# Patient Record
Sex: Male | Born: 1990 | Race: White | Hispanic: No | Marital: Single | Smoking: Current every day smoker
Health system: Southern US, Community
[De-identification: ages and names within clinical notes are randomized; demographics above are authoritative.]

---

## 2010-03-10 ENCOUNTER — Emergency Department (HOSPITAL_COMMUNITY)
Admission: EM | Admit: 2010-03-10 | Discharge: 2010-03-10 | Disposition: A | Discharge status: Home / Self Care | Payer: PRIVATE HEALTH INSURANCE | Attending: Emergency Medicine | Admitting: Emergency Medicine

## 2010-03-10 DIAGNOSIS — K029 Dental caries, unspecified: Secondary | ICD-10-CM | POA: Insufficient documentation

## 2010-03-10 DIAGNOSIS — K089 Disorder of teeth and supporting structures, unspecified: Secondary | ICD-10-CM | POA: Insufficient documentation

## 2014-08-29 ENCOUNTER — Emergency Department (HOSPITAL_COMMUNITY): Payer: Self-pay

## 2014-08-29 ENCOUNTER — Emergency Department (HOSPITAL_COMMUNITY): Payer: PRIVATE HEALTH INSURANCE

## 2014-08-29 ENCOUNTER — Encounter (HOSPITAL_COMMUNITY): Payer: Self-pay | Admitting: Emergency Medicine

## 2014-08-29 ENCOUNTER — Emergency Department (HOSPITAL_COMMUNITY)
Admission: EM | Admit: 2014-08-29 | Discharge: 2014-08-29 | Disposition: A | Discharge status: Home / Self Care | Payer: PRIVATE HEALTH INSURANCE | Attending: Emergency Medicine | Admitting: Emergency Medicine

## 2014-08-29 DIAGNOSIS — R109 Unspecified abdominal pain: Secondary | ICD-10-CM | POA: Insufficient documentation

## 2014-08-29 DIAGNOSIS — N132 Hydronephrosis with renal and ureteral calculous obstruction: Secondary | ICD-10-CM | POA: Insufficient documentation

## 2014-08-29 DIAGNOSIS — M549 Dorsalgia, unspecified: Secondary | ICD-10-CM

## 2014-08-29 DIAGNOSIS — Z720 Tobacco use: Secondary | ICD-10-CM | POA: Insufficient documentation

## 2014-08-29 DIAGNOSIS — N50812 Left testicular pain: Secondary | ICD-10-CM

## 2014-08-29 LAB — URINALYSIS, ROUTINE W REFLEX MICROSCOPIC
BILIRUBIN URINE: NEGATIVE
GLUCOSE, UA: NEGATIVE mg/dL
Ketones, ur: NEGATIVE mg/dL
Nitrite: NEGATIVE
PH: 6 (ref 5.0–8.0)
Protein, ur: NEGATIVE mg/dL
Specific Gravity, Urine: 1.024 (ref 1.005–1.030)
Urobilinogen, UA: 0.2 mg/dL (ref 0.0–1.0)

## 2014-08-29 LAB — CBC WITH DIFFERENTIAL/PLATELET
BASOS PCT: 0 % (ref 0–1)
Basophils Absolute: 0.1 10*3/uL (ref 0.0–0.1)
EOS PCT: 3 % (ref 0–5)
Eosinophils Absolute: 0.4 10*3/uL (ref 0.0–0.7)
HCT: 45 % (ref 39.0–52.0)
Hemoglobin: 15.4 g/dL (ref 13.0–17.0)
Lymphocytes Relative: 21 % (ref 12–46)
Lymphs Abs: 2.7 10*3/uL (ref 0.7–4.0)
MCH: 30.5 pg (ref 26.0–34.0)
MCHC: 34.2 g/dL (ref 30.0–36.0)
MCV: 89.1 fL (ref 78.0–100.0)
Monocytes Absolute: 1.1 10*3/uL — ABNORMAL HIGH (ref 0.1–1.0)
Monocytes Relative: 8 % (ref 3–12)
NEUTROS ABS: 8.7 10*3/uL — AB (ref 1.7–7.7)
Neutrophils Relative %: 68 % (ref 43–77)
Platelets: 258 10*3/uL (ref 150–400)
RBC: 5.05 MIL/uL (ref 4.22–5.81)
RDW: 14.1 % (ref 11.5–15.5)
WBC: 12.9 10*3/uL — AB (ref 4.0–10.5)

## 2014-08-29 LAB — BASIC METABOLIC PANEL
Anion gap: 9 (ref 5–15)
BUN: 8 mg/dL (ref 6–20)
CALCIUM: 9.5 mg/dL (ref 8.9–10.3)
CO2: 25 mmol/L (ref 22–32)
Chloride: 108 mmol/L (ref 101–111)
Creatinine, Ser: 0.76 mg/dL (ref 0.61–1.24)
GFR calc Af Amer: >60 mL/min (ref >60)
Glucose, Bld: 92 mg/dL (ref 65–99)
Potassium: 3.8 mmol/L (ref 3.5–5.1)
Sodium: 142 mmol/L (ref 135–145)

## 2014-08-29 LAB — URINE MICROSCOPIC-ADD ON

## 2014-08-29 MED ORDER — HYDROCODONE-ACETAMINOPHEN 5-325 MG PO TABS: 1.0000 | ORAL_TABLET | ORAL | Status: DC | PRN

## 2014-08-29 MED ORDER — IBUPROFEN 600 MG PO TABS: 600.0000 mg | ORAL_TABLET | Freq: Four times a day (QID) | ORAL | Status: AC | PRN

## 2014-08-29 MED ORDER — ONDANSETRON HCL 4 MG PO TABS: 4.0000 mg | ORAL_TABLET | Freq: Four times a day (QID) | ORAL | Status: AC

## 2014-08-29 NOTE — ED Notes (Signed)
Pt sts episode x 2 of  Severe left testicle pain into groin area

## 2014-08-29 NOTE — ED Provider Notes (Signed)
CSN: [id number]     Arrival date & time 08/29/14  4098 History  This chart was scribed for non-physician practitioner, Dorena Dew. Neva Seat, PA-C working with Mancel Bale, MD by Gwenyth Ober, ED scribe. This patient was seen in room TR02C/TR02C and the patient's care was started at 7:33 PM  Chief Complaint  Patient presents with  . Testicle Pain   The history is provided by the patient. No language interpreter was used.   HPI Comments: Joshua Harrell is a 24 y.o. male who presents to the Emergency Department complaining of left-sided flank pain that radiates to his left testicle started 4 days ago, resolved yesterday, and returned today. Pt states nausea as an associated symptom. He also notes his urine is darker than normal. Pt denies a history of kidney stones or similar pain. He also denies dysuria, gross hematuria, vomiting, swelling in his testicles, pain with touching his penis and penile discharge as associated symptoms.   PCP: No primary care provider on file. Blood pressure 126/79, pulse 68, temperature 98 F (36.7 C), temperature source Oral, resp. rate 17, height 5\' 10"  (1.778 m), weight 192 lb (87.091 kg), SpO2 99 %.  The patient denies diaphoresis, fever, headache, weakness (general or focal), confusion, change of vision,  neck pain, dysphagia, aphagia, chest pain, shortness of breath,  back pain,  vomiting, diarrhea, lower extremity swelling, rash.  History reviewed. No pertinent past medical history. History reviewed. No pertinent past surgical history. History reviewed. No pertinent family history. History  Substance Use Topics  . Smoking status: Current Every Day Smoker  . Smokeless tobacco: Not on file  . Alcohol Use: Yes    Review of Systems  Gastrointestinal: Positive for nausea. Negative for vomiting.  Genitourinary: Positive for flank pain. Negative for dysuria, hematuria, discharge, penile swelling, scrotal swelling and penile pain.  All other systems reviewed and are  negative.   Allergies  Review of patient's allergies indicates no known allergies.  Home Medications   Prior to Admission medications   Medication Sig Start Date End Date Taking? Authorizing Provider  HYDROcodone-acetaminophen (NORCO/VICODIN) 5-325 MG per tablet Take 1-2 tablets by mouth every 4 (four) hours as needed. 08/29/14   Kip Cropp Neva Seat, PA-C  ibuprofen (ADVIL,MOTRIN) 600 MG tablet Take 1 tablet (600 mg total) by mouth every 6 (six) hours as needed. 08/29/14   Kais Monje Neva Seat, PA-C  ondansetron (ZOFRAN) 4 MG tablet Take 1 tablet (4 mg total) by mouth every 6 (six) hours. 08/29/14   Shanisha Lech Neva Seat, PA-C   BP 126/79 mmHg  Pulse 68  Temp(Src) 98 F (36.7 C) (Oral)  Resp 17  Ht 5\' 10"  (1.778 m)  Wt 192 lb (87.091 kg)  BMI 27.55 kg/m2  SpO2 99% Physical Exam  Constitutional: He appears well-developed and well-nourished. No distress.  HENT:  Head: Normocephalic and atraumatic.  Eyes: Conjunctivae and EOM are normal.  Neck: Neck supple. No tracheal deviation present.  Cardiovascular: Normal rate.   Pulmonary/Chest: Effort normal. No respiratory distress.  Abdominal: Bowel sounds are normal. There is no hepatosplenomegaly. There is tenderness. There is CVA tenderness (left). There is no rigidity, no rebound and no guarding.  Genitourinary:  No swelling or pain to testicles  Skin: Skin is warm and dry.  Psychiatric: He has a normal mood and affect. His behavior is normal.  Nursing note and vitals reviewed.   ED Course  Procedures   DIAGNOSTIC STUDIES: Oxygen Saturation is 99% on RA, normal by my interpretation.    COORDINATION OF CARE: 8:44 PM  Discussed treatment plan with pt which includes CBC, BMP and CT abdomen pelvis without contrast. Pt declined pain medication. He agreed to plan.  Labs Review Labs Reviewed  URINALYSIS, ROUTINE W REFLEX MICROSCOPIC (NOT AT [facility]) - Abnormal; Notable for the following:    Color, Urine AMBER (*)    APPearance CLOUDY (*)    Hgb urine  dipstick LARGE (*)    Leukocytes, UA TRACE (*)    All other components within normal limits  URINE MICROSCOPIC-ADD ON    Imaging Review US Scrotum  08/29/2014   CLINICAL DATA:  24 year old male complaining of 2 episodes of severe left-sided testicular pain since last Thursday. He  EXAM: ULTRASOUND OF SCROTUM  TECHNIQUE: Complete ultrasound examination of the testicles, epididymis, and other scrotal structures was performed.  COMPARISON:  No priors.  FINDINGS: Right testicle  Measurements: 4.2 x 2.6 x 2.9 cm. No mass or microlithiasis visualized.  Left testicle  Measurements: 4.3 x 2.6 x 2.5 cm. No mass or microlithiasis visualized.  Right epididymis: Normal in size. Tiny 2 mm anechoic lesion with increased through transmission, compatible with an epididymal cyst or spermatocele.  Left epididymis: Normal in size. Several tiny anechoic lesions with increased through transmission, compatible with epididymal cysts or spermatoceles, largest of which measures 4 x 5 x 5 mm.  Hydrocele:  None visualized.  Varicocele:  None visualized.  IMPRESSION: 1. No acute findings. Specifically, no evidence of testicular torsion, and no findings to suggest epididymo-orchitis. 2. Multiple small epididymal cysts or spermatoceles bilaterally (left greater than right), as above.   Electronically Signed   By: Trudie Reed M.D.   On: 08/29/2014 19:29   [place] Art/ven Flow Abd Pelv Doppler  08/29/2014   CLINICAL DATA:  24 year old male complaining of 2 episodes of severe left-sided testicular pain since last Thursday. He  EXAM: ULTRASOUND OF SCROTUM  TECHNIQUE: Complete ultrasound examination of the testicles, epididymis, and other scrotal structures was performed.  COMPARISON:  No priors.  FINDINGS: Right testicle  Measurements: 4.2 x 2.6 x 2.9 cm. No mass or microlithiasis visualized.  Left testicle  Measurements: 4.3 x 2.6 x 2.5 cm. No mass or microlithiasis visualized.  Right epididymis: Normal in size. Tiny 2 mm anechoic lesion  with increased through transmission, compatible with an epididymal cyst or spermatocele.  Left epididymis: Normal in size. Several tiny anechoic lesions with increased through transmission, compatible with epididymal cysts or spermatoceles, largest of which measures 4 x 5 x 5 mm.  Hydrocele:  None visualized.  Varicocele:  None visualized.  IMPRESSION: 1. No acute findings. Specifically, no evidence of testicular torsion, and no findings to suggest epididymo-orchitis. 2. Multiple small epididymal cysts or spermatoceles bilaterally (left greater than right), as above.   Electronically Signed   By: Trudie Reed M.D.   On: 08/29/2014 19:29     EKG Interpretation None      MDM   Final diagnoses:  CVA tenderness  Ureteral stone with hydronephrosis   The patient had [place] of his scrotum ordered in triage prior to my examination. He has not had pain to his testicles or swelling. He felt pain going towards his testicles. Blood in his urine but no infection, small 3 mm stone   CT abd/pelv wo contrast IMPRESSION: Mild left-sided hydronephrosis, with an obstructing 3 mm stone in the proximal left ureter, just below the left ureteropelvic Junction.  The stone is small and is no longer causing him pain. I ask that he follow-up with Urology, he did not require  any pain medication during his stay. Will encourage him to drink plenty of water, given rx for pain and nausea medication and safety guidelines regarding medications. Referral to Urology.   Electronically Signed By: Roanna Raider M.D. On: 08/29/2014 21:48 Medications - No data to display  24 y.o.Elenore Paddy evaluation in the Emergency Department is complete. It has been determined that no acute conditions requiring further emergency intervention are present at this time. The patient/guardian have been advised of the diagnosis and plan. We have discussed signs and symptoms that warrant return to the ED, such as changes or worsening in  symptoms.  Vital signs are stable at discharge. Filed Vitals:   08/29/14 2223  BP: 126/79  Pulse: 68  Temp: 98 F (36.7 C)  Resp: 17    Patient/guardian has voiced understanding and agreed to follow-up with the PCP or specialist.  I personally performed the services described in this documentation, which was scribed in my presence. The recorded information has been reviewed and is accurate.       Marlon Pel, PA-C 08/29/14 2330  Mancel Bale, MD 08/31/14 [phone]

## 2014-08-29 NOTE — Discharge Instructions (Signed)

## 2014-08-31 LAB — URINE CULTURE
Culture: 4000
Special Requests: NORMAL

## 2015-03-18 ENCOUNTER — Encounter (HOSPITAL_COMMUNITY): Payer: Self-pay | Admitting: *Deleted

## 2015-03-18 ENCOUNTER — Emergency Department (HOSPITAL_COMMUNITY)
Admission: EM | Admit: 2015-03-18 | Discharge: 2015-03-18 | Disposition: A | Discharge status: Home / Self Care | Payer: 59 | Attending: Emergency Medicine | Admitting: Emergency Medicine

## 2015-03-18 ENCOUNTER — Emergency Department (HOSPITAL_COMMUNITY): Payer: 59

## 2015-03-18 DIAGNOSIS — F17200 Nicotine dependence, unspecified, uncomplicated: Secondary | ICD-10-CM | POA: Diagnosis not present

## 2015-03-18 DIAGNOSIS — S52121A Displaced fracture of head of right radius, initial encounter for closed fracture: Secondary | ICD-10-CM | POA: Diagnosis not present

## 2015-03-18 DIAGNOSIS — S59901A Unspecified injury of right elbow, initial encounter: Secondary | ICD-10-CM | POA: Diagnosis present

## 2015-03-18 DIAGNOSIS — Y9351 Activity, roller skating (inline) and skateboarding: Secondary | ICD-10-CM | POA: Insufficient documentation

## 2015-03-18 DIAGNOSIS — Y9289 Other specified places as the place of occurrence of the external cause: Secondary | ICD-10-CM | POA: Diagnosis not present

## 2015-03-18 DIAGNOSIS — Y998 Other external cause status: Secondary | ICD-10-CM | POA: Insufficient documentation

## 2015-03-18 DIAGNOSIS — S4991XA Unspecified injury of right shoulder and upper arm, initial encounter: Secondary | ICD-10-CM | POA: Insufficient documentation

## 2015-03-18 DIAGNOSIS — S6991XA Unspecified injury of right wrist, hand and finger(s), initial encounter: Secondary | ICD-10-CM | POA: Diagnosis not present

## 2015-03-18 DIAGNOSIS — S42401A Unspecified fracture of lower end of right humerus, initial encounter for closed fracture: Secondary | ICD-10-CM

## 2015-03-18 MED ORDER — HYDROCODONE-ACETAMINOPHEN 5-325 MG PO TABS
1.0000 | ORAL_TABLET | Freq: Once | ORAL | Status: AC
  Administered 2015-03-18: 1 via ORAL
  Filled 2015-03-18: qty 1

## 2015-03-18 MED ORDER — NAPROXEN 500 MG PO TABS: 500.0000 mg | ORAL_TABLET | Freq: Two times a day (BID) | ORAL | Status: DC

## 2015-03-18 MED ORDER — PENICILLIN V POTASSIUM 500 MG PO TABS: 500.0000 mg | ORAL_TABLET | Freq: Four times a day (QID) | ORAL | Status: DC

## 2015-03-18 MED ORDER — HYDROCODONE-ACETAMINOPHEN 5-325 MG PO TABS: 1.0000 | ORAL_TABLET | ORAL | Status: AC | PRN

## 2015-03-18 NOTE — ED Provider Notes (Signed)
CSN: [id number]     Arrival date & time 03/18/15  1709 History  By signing my name below, I, Emmanuella Mensah, attest that this documentation has been prepared under the direction and in the presence of [employer], PA-C . Electronically Signed: Angelene Giovanni, ED Scribe. 03/18/2015. 5:55 PM.    Chief Complaint  Patient presents with  . Arm Injury   The history is provided by the patient. No language interpreter was used.   HPI Comments: Joshua Harrell is a 25 y.o. male who presents to the Emergency Department complaining of gradually worsening constant right arm pain, concentrated at the right elbow and cubital fossa and right wrist pain s/p fall that occurred several hours ago. He reports associated limited ROM secondary to pain. He explains that he fell while skateboarding down a ramp attached to the back of a van, falling on his right side, hitting his elbow. No alleviating factors noted. Pt has not taken any medications for these symptoms. He denies any numbness, weakness, fever, chills, or n/v.    History reviewed. No pertinent past medical history. History reviewed. No pertinent past surgical history. History reviewed. No pertinent family history. Social History  Substance Use Topics  . Smoking status: Current Every Day Smoker  . Smokeless tobacco: Never Used  . Alcohol Use: 0.6 oz/week    1 Cans of beer per week     Comment: social    Review of Systems  Constitutional: Negative for fever and chills.  Gastrointestinal: Negative for nausea and vomiting.  Musculoskeletal: Positive for arthralgias (right arm, right elbow, right wrist).  Neurological: Negative for weakness and numbness.  All other systems reviewed and are negative.     Allergies  Fish allergy  Home Medications   Prior to Admission medications   Medication Sig Start Date End Date Taking? Authorizing Provider  ibuprofen (ADVIL,MOTRIN) 600 MG tablet Take 1 tablet (600 mg total) by mouth every 6 (six)  hours as needed. 08/29/14  Yes Tiffany Neva Seat, PA-C  ondansetron (ZOFRAN) 4 MG tablet Take 1 tablet (4 mg total) by mouth every 6 (six) hours. 08/29/14  Yes Marlon Pel, PA-C  HYDROcodone-acetaminophen (NORCO/VICODIN) 5-325 MG tablet Take 1-2 tablets by mouth every 4 (four) hours as needed. 03/18/15   Joycie Peek, PA-C   BP 140/91 mmHg  Pulse 94  Temp(Src) 98.1 F (36.7 C) (Oral)  Resp 14  SpO2 95% Physical Exam  Constitutional: He is oriented to person, place, and time. He appears well-developed and well-nourished. No distress.  HENT:  Head: Normocephalic and atraumatic.  Eyes: Conjunctivae and EOM are normal.  Neck: Neck supple. No tracheal deviation present.  Cardiovascular: Normal rate and regular rhythm.   Pulmonary/Chest: Effort normal. No respiratory distress.  Musculoskeletal: Normal range of motion. He exhibits tenderness.  Distal pulses intact Arrived with arm sling Full active ROM in right wrist Right elbow: Flexion at 90, cannot complete further, secondary to pain Mildly decreased extension, sec to pain No tenderness over olecranon  No obvious deformity or abnormalities noted.   Neurological: He is alert and oriented to person, place, and time.  Skin: Skin is warm and dry.  Psychiatric: He has a normal mood and affect. His behavior is normal.  Nursing note and vitals reviewed.   ED Course  Procedures (including critical care time) DIAGNOSTIC STUDIES: Oxygen Saturation is 97% on RA, normal by my interpretation.    COORDINATION OF CARE: 5:51 PM- Pt advised of plan for treatment and pt agrees. Pt will receive x-ray for  further evaluation.   Imaging Review Dg Elbow Complete Right  03/18/2015  CLINICAL DATA:  Fall while skateboarding with elbow pain, initial encounter EXAM: RIGHT ELBOW - COMPLETE 3+ VIEW COMPARISON:  None. FINDINGS: There is a mildly displaced fracture through the radial head with impaction identified. Large joint effusion is seen. No other focal  abnormality is noted. IMPRESSION: Right radial head fracture with joint effusion. Electronically Signed   By: Alcide Clever M.D.   On: 03/18/2015 18:46     Joycie Peek, PA-C has personally reviewed and evaluated these images as part of his medical decision-making.  Meds given in ED:  Medications  HYDROcodone-acetaminophen (NORCO/VICODIN) 5-325 MG per tablet 1 tablet (1 tablet Oral Given 03/18/15 1810)    New Prescriptions   No medications on file   Filed Vitals:   03/18/15 1714 03/18/15 1729  BP: 124/88 140/91  Pulse: 100 94  Temp: 98.2 F (36.8 C) 98.1 F (36.7 C)  TempSrc: Oral Oral  Resp: 20 14  SpO2: 97% 95%    MDM  Presents for evaluation of right elbow pain after falling while skateboarding.Cathlean Sauer shows right radial head fracture with joint effusion. Pain managed in ED. patient declines referral to orthopedics as he is moving to [place] on Monday. Encouraged him to follow-up with orthopedics in [place]. Patient given arm sling while in ED, conservative therapy recommended and discussed. Patient will be dc home & is agreeable with above plan.  Final diagnoses:  Elbow fracture, right, closed, initial encounter    I personally performed the services described in this documentation, which was scribed in my presence. The recorded information has been reviewed and is accurate.   Joycie Peek, PA-C 03/18/15 2109  Melene Plan, DO 03/18/15 [phone]

## 2015-03-18 NOTE — Discharge Instructions (Signed)
You were evaluated in the ED today after fall. You were found to have a broken elbow. Please follow-up with orthopedics for reevaluation in the next week or so. Please take your pain medicine as prescribed for severe pain. Continue using Motrin or Tylenol for moderate pain. Return to ED for new or worsening symptoms as we discussed.

## 2015-03-18 NOTE — ED Notes (Signed)
Declined W/C at D/C and was escorted to lobby by RN. 

## 2016-07-06 IMAGING — DX DG ELBOW COMPLETE 3+V*R*
4 series · 4 of 4 positions shown · non-contrast
Comparison: None.

CLINICAL DATA: Fall while skateboarding with elbow pain, initial
encounter

EXAM:
RIGHT ELBOW - COMPLETE 3+ VIEW

[elbow ap]
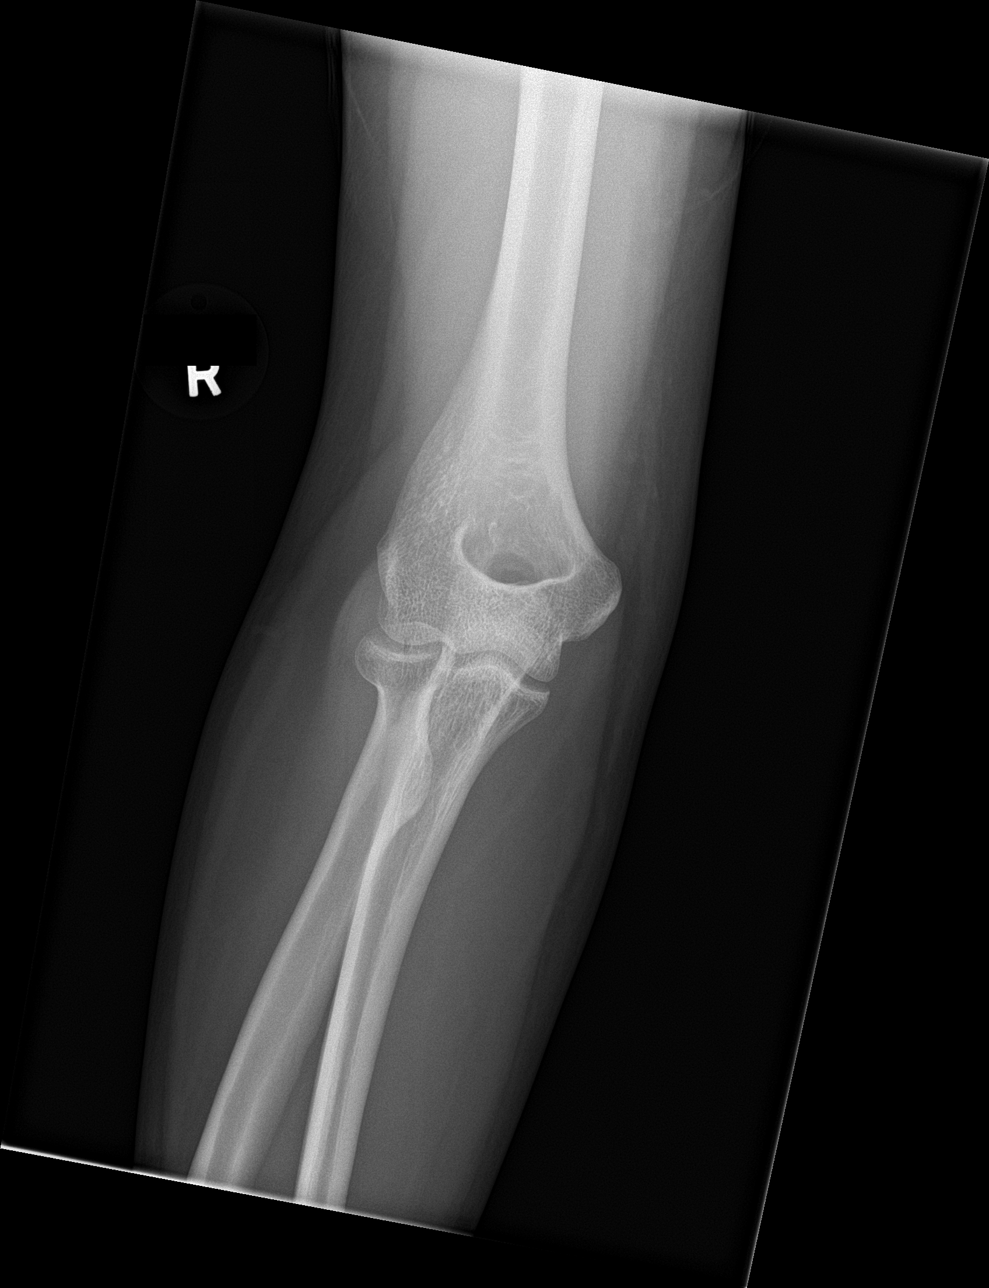

[elbow obl (1 of 2)]
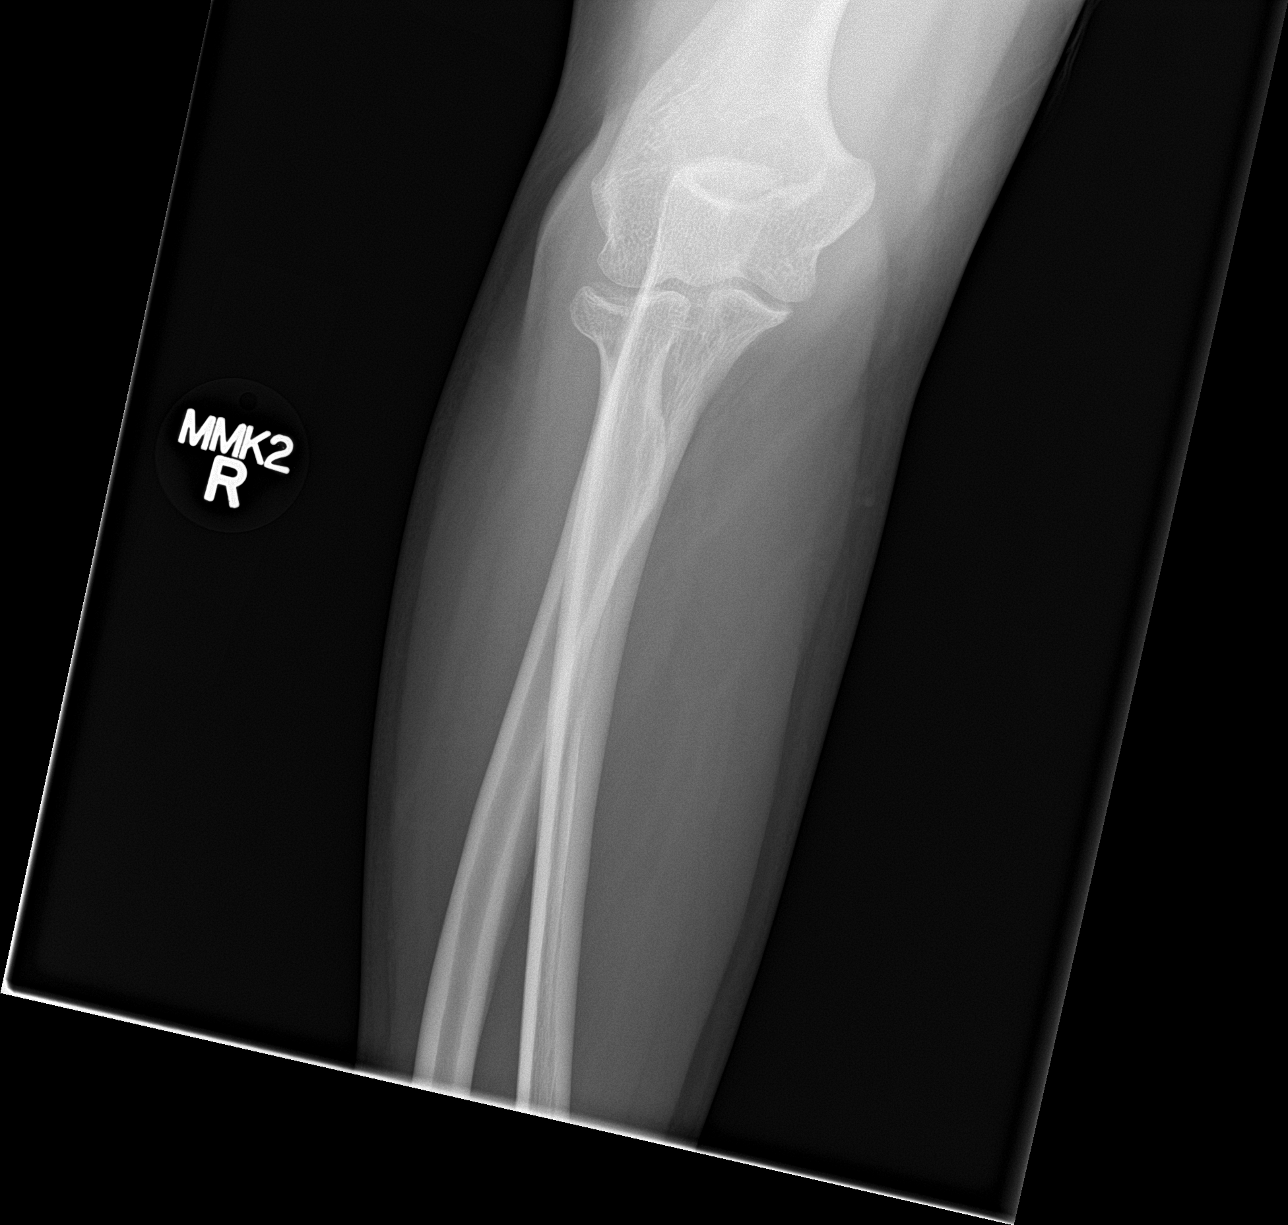

[elbow lat]
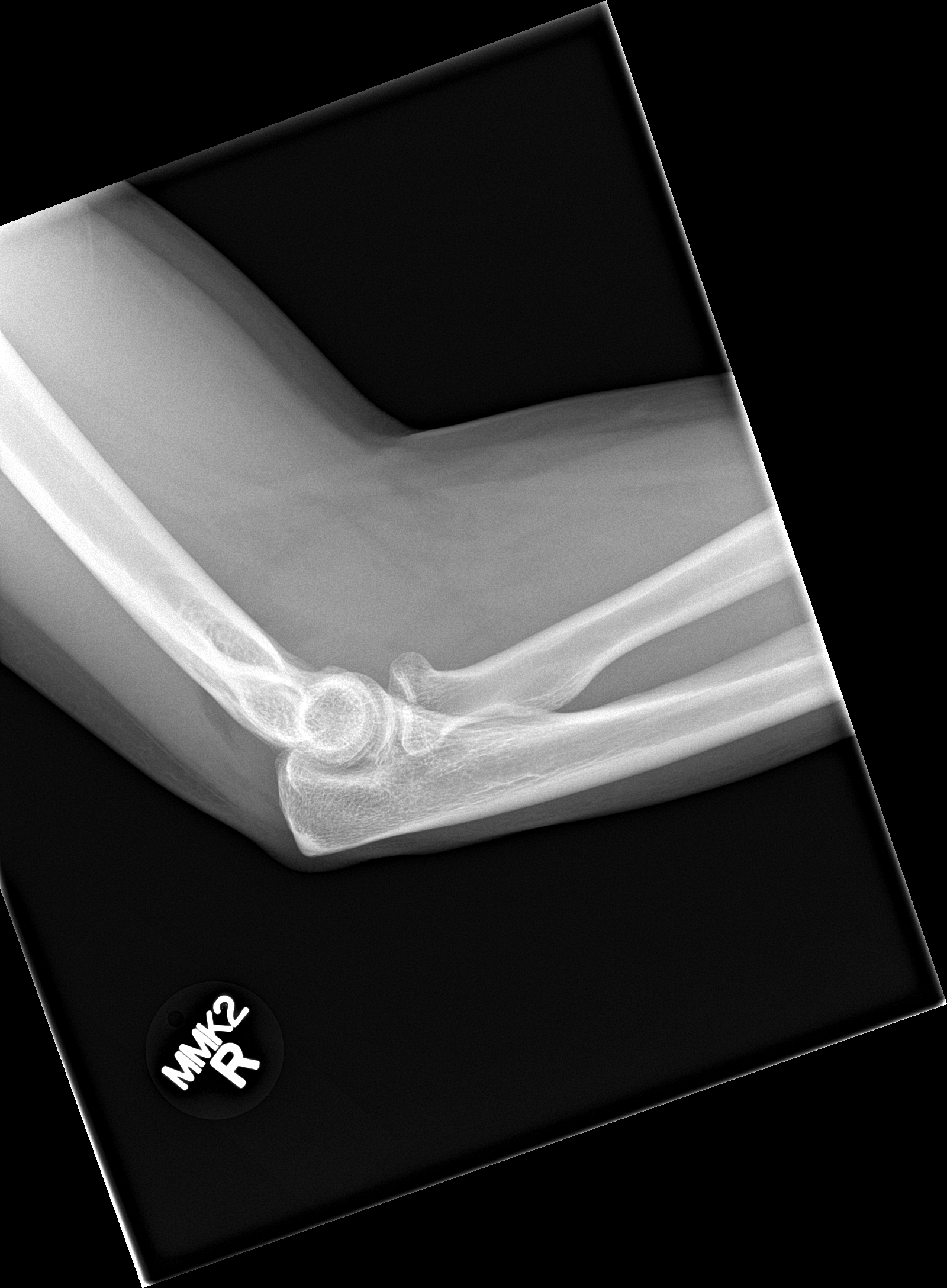

[elbow obl (2 of 2)]
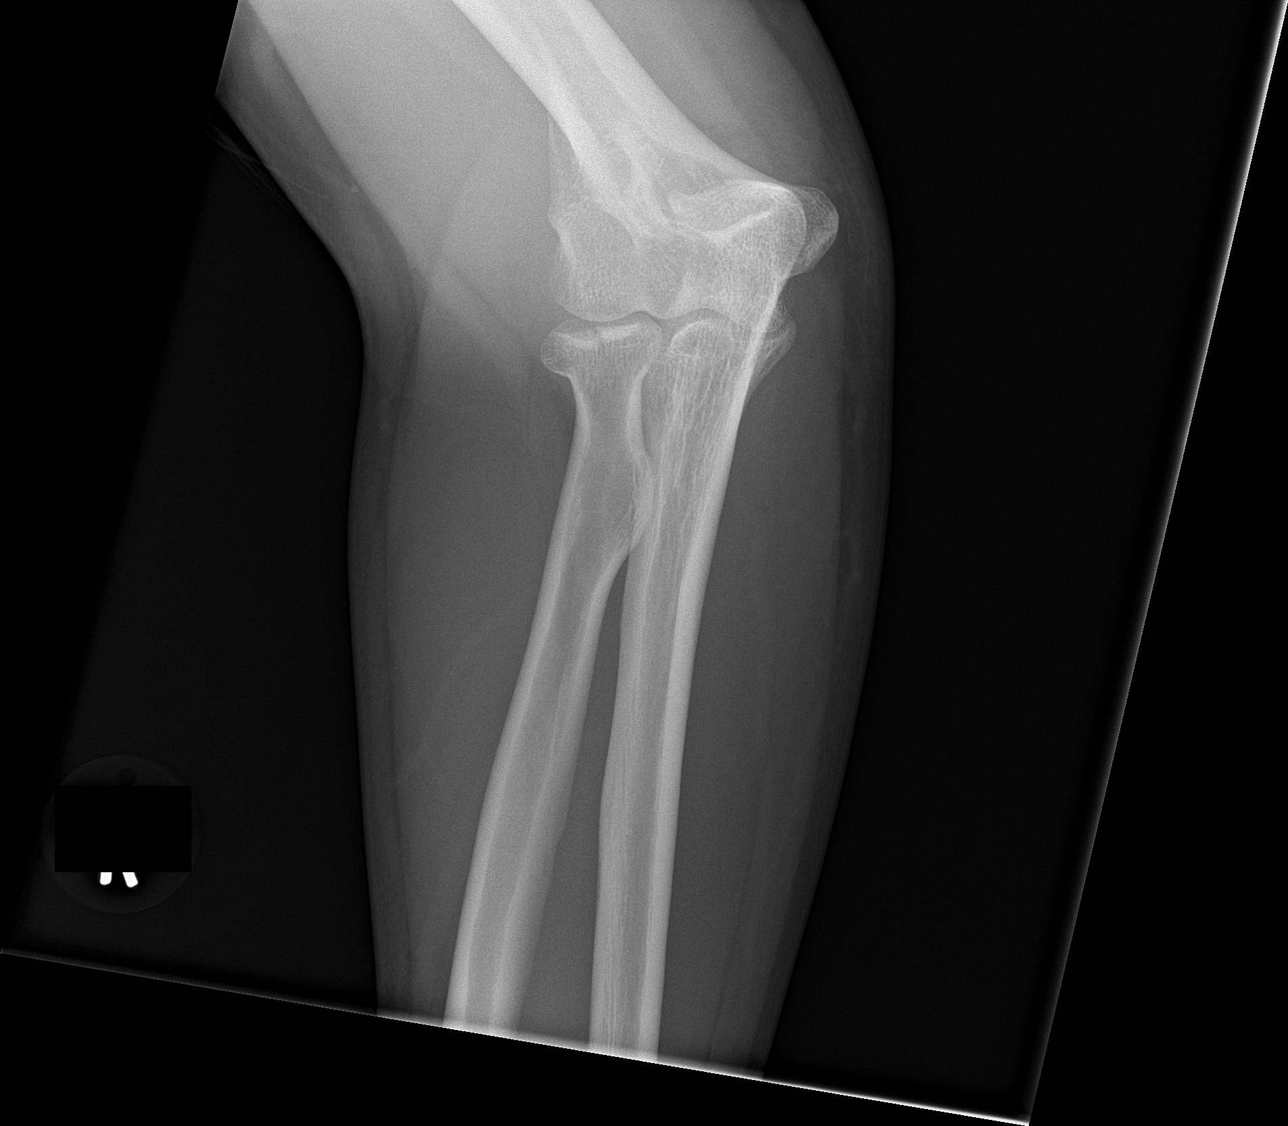

[4 of 4 positions shown; findings below may reference images not displayed]

FINDINGS: There is a mildly displaced fracture through the radial head with
impaction identified. Large joint effusion is seen. No other focal
abnormality is noted.
IMPRESSION: Right radial head fracture with joint effusion.
